# Patient Record
Sex: Male | Born: 1985 | Race: Black or African American | Hispanic: No | Marital: Single
Health system: Midwestern US, Community
[De-identification: ages and names within clinical notes are randomized; demographics above are authoritative.]

## PROBLEM LIST (undated history)

## (undated) DIAGNOSIS — Z9889 Other specified postprocedural states: Secondary | ICD-10-CM

## (undated) DIAGNOSIS — K5900 Constipation, unspecified: Secondary | ICD-10-CM

## (undated) HISTORY — PX: HAND SURGERY: SHX662

## (undated) HISTORY — PX: ABDOMINAL SURGERY: SHX537

---

## 2013-11-06 ENCOUNTER — Encounter (HOSPITAL_BASED_OUTPATIENT_CLINIC_OR_DEPARTMENT_OTHER): Payer: Self-pay | Admitting: Emergency Medicine

## 2013-11-06 ENCOUNTER — Inpatient Hospital Stay (HOSPITAL_BASED_OUTPATIENT_CLINIC_OR_DEPARTMENT_OTHER)
Admission: EM | Admit: 2013-11-06 | Discharge: 2013-11-11 | DRG: 389 | Disposition: A | Discharge status: Home / Self Care | Attending: Internal Medicine | Admitting: Internal Medicine

## 2013-11-06 ENCOUNTER — Emergency Department (HOSPITAL_BASED_OUTPATIENT_CLINIC_OR_DEPARTMENT_OTHER)

## 2013-11-06 DIAGNOSIS — E860 Dehydration: Secondary | ICD-10-CM | POA: Diagnosis present

## 2013-11-06 DIAGNOSIS — K5900 Constipation, unspecified: Secondary | ICD-10-CM | POA: Diagnosis present

## 2013-11-06 DIAGNOSIS — Z79899 Other long term (current) drug therapy: Secondary | ICD-10-CM

## 2013-11-06 DIAGNOSIS — E559 Vitamin D deficiency, unspecified: Secondary | ICD-10-CM | POA: Diagnosis present

## 2013-11-06 DIAGNOSIS — K920 Hematemesis: Secondary | ICD-10-CM | POA: Diagnosis present

## 2013-11-06 DIAGNOSIS — Z91010 Allergy to peanuts: Secondary | ICD-10-CM

## 2013-11-06 DIAGNOSIS — F17200 Nicotine dependence, unspecified, uncomplicated: Secondary | ICD-10-CM | POA: Diagnosis present

## 2013-11-06 DIAGNOSIS — M26609 Unspecified temporomandibular joint disorder, unspecified side: Secondary | ICD-10-CM | POA: Diagnosis present

## 2013-11-06 DIAGNOSIS — K56609 Unspecified intestinal obstruction, unspecified as to partial versus complete obstruction: Principal | ICD-10-CM | POA: Diagnosis present

## 2013-11-06 DIAGNOSIS — N289 Disorder of kidney and ureter, unspecified: Secondary | ICD-10-CM

## 2013-11-06 DIAGNOSIS — Z91018 Allergy to other foods: Secondary | ICD-10-CM

## 2013-11-06 DIAGNOSIS — N179 Acute kidney failure, unspecified: Secondary | ICD-10-CM | POA: Diagnosis present

## 2013-11-06 HISTORY — DX: Other specified postprocedural states: Z98.890

## 2013-11-06 HISTORY — DX: Constipation, unspecified: K59.00

## 2013-11-06 LAB — COMPREHENSIVE METABOLIC PANEL
ALBUMIN: 5 g/dL (ref 3.5–5.2)
ALT: 8 U/L (ref 0–53)
AST: 16 U/L (ref 0–37)
Alkaline Phosphatase: 68 U/L (ref 39–117)
Anion gap: 16 — ABNORMAL HIGH (ref 5–15)
BILIRUBIN TOTAL: 1.1 mg/dL (ref 0.3–1.2)
BUN: 23 mg/dL (ref 6–23)
CO2: 32 meq/L (ref 19–32)
CREATININE: 1.6 mg/dL — AB (ref 0.50–1.35)
Calcium: 11.2 mg/dL — ABNORMAL HIGH (ref 8.4–10.5)
Chloride: 94 mEq/L — ABNORMAL LOW (ref 96–112)
GFR calc Af Amer: 67 mL/min — ABNORMAL LOW (ref >90)
GFR, EST NON AFRICAN AMERICAN: 58 mL/min — AB (ref >90)
Glucose, Bld: 100 mg/dL — ABNORMAL HIGH (ref 70–99)
Potassium: 4.5 mEq/L (ref 3.7–5.3)
Sodium: 142 mEq/L (ref 137–147)
Total Protein: 8.8 g/dL — ABNORMAL HIGH (ref 6.0–8.3)

## 2013-11-06 LAB — CBC WITH DIFFERENTIAL/PLATELET
BASOS ABS: 0 10*3/uL (ref 0.0–0.1)
Basophils Relative: 0 % (ref 0–1)
EOS ABS: 0 10*3/uL (ref 0.0–0.7)
EOS PCT: 0 % (ref 0–5)
HEMATOCRIT: 46.6 % (ref 39.0–52.0)
Hemoglobin: 15.3 g/dL (ref 13.0–17.0)
LYMPHS PCT: 17 % (ref 12–46)
Lymphs Abs: 1 10*3/uL (ref 0.7–4.0)
MCH: 29.5 pg (ref 26.0–34.0)
MCHC: 32.8 g/dL (ref 30.0–36.0)
MCV: 89.8 fL (ref 78.0–100.0)
MONO ABS: 0.4 10*3/uL (ref 0.1–1.0)
Monocytes Relative: 8 % (ref 3–12)
Neutro Abs: 4.2 10*3/uL (ref 1.7–7.7)
Neutrophils Relative %: 75 % (ref 43–77)
Platelets: 214 10*3/uL (ref 150–400)
RBC: 5.19 MIL/uL (ref 4.22–5.81)
RDW: 13.3 % (ref 11.5–15.5)
WBC: 5.6 10*3/uL (ref 4.0–10.5)

## 2013-11-06 LAB — I-STAT CG4 LACTIC ACID, ED: LACTIC ACID, VENOUS: 1.68 mmol/L (ref 0.5–2.2)

## 2013-11-06 LAB — LIPASE, BLOOD: LIPASE: 19 U/L (ref 11–59)

## 2013-11-06 MED ORDER — MORPHINE SULFATE 4 MG/ML IJ SOLN
4.0000 mg | Freq: Once | INTRAMUSCULAR | Status: AC
  Administered 2013-11-06: 4 mg via INTRAVENOUS
  Filled 2013-11-06: qty 1

## 2013-11-06 MED ORDER — CHLORHEXIDINE GLUCONATE 0.12 % MT SOLN
15.0000 mL | Freq: Two times a day (BID) | OROMUCOSAL | Status: DC
  Administered 2013-11-06 – 2013-11-08 (×5): 15 mL via OROMUCOSAL
  Filled 2013-11-06 (×8): qty 15

## 2013-11-06 MED ORDER — SODIUM CHLORIDE 0.9 % IV SOLN
Freq: Once | INTRAVENOUS | Status: AC
  Administered 2013-11-06: 1000 mL via INTRAVENOUS

## 2013-11-06 MED ORDER — CETYLPYRIDINIUM CHLORIDE 0.05 % MT LIQD
7.0000 mL | Freq: Two times a day (BID) | OROMUCOSAL | Status: DC
  Administered 2013-11-07: 7 mL via OROMUCOSAL

## 2013-11-06 MED ORDER — SODIUM CHLORIDE 0.9 % IV SOLN
INTRAVENOUS | Status: AC
  Administered 2013-11-06: 18:00:00 via INTRAVENOUS

## 2013-11-06 MED ORDER — MORPHINE SULFATE 2 MG/ML IJ SOLN
2.0000 mg | INTRAMUSCULAR | Status: DC | PRN
  Administered 2013-11-06 – 2013-11-08 (×8): 2 mg via INTRAVENOUS
  Filled 2013-11-06 (×8): qty 1

## 2013-11-06 MED ORDER — PROMETHAZINE HCL 25 MG/ML IJ SOLN
INTRAMUSCULAR | Status: AC
  Administered 2013-11-06: 12.5 mg via INTRAVENOUS
  Filled 2013-11-06: qty 1

## 2013-11-06 MED ORDER — SODIUM CHLORIDE 0.9 % IV SOLN
INTRAVENOUS | Status: AC
  Administered 2013-11-06: 19:00:00 via INTRAVENOUS

## 2013-11-06 MED ORDER — PROMETHAZINE HCL 25 MG/ML IJ SOLN
12.5000 mg | Freq: Once | INTRAMUSCULAR | Status: AC
  Administered 2013-11-06: 12.5 mg via INTRAVENOUS

## 2013-11-06 MED ORDER — PROMETHAZINE HCL 25 MG/ML IJ SOLN: 12.5000 mg | Freq: Once | INTRAMUSCULAR | Status: DC

## 2013-11-06 MED ORDER — ONDANSETRON HCL 4 MG/2ML IJ SOLN
4.0000 mg | Freq: Once | INTRAMUSCULAR | Status: AC
  Administered 2013-11-06: 4 mg via INTRAVENOUS
  Filled 2013-11-06: qty 2

## 2013-11-06 MED ORDER — SODIUM CHLORIDE 0.9 % IV SOLN
Freq: Once | INTRAVENOUS | Status: AC
  Administered 2013-11-07: 06:00:00 via INTRAVENOUS
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
## 2013-11-06 NOTE — ED Notes (Signed)
[facility] --[id number]

## 2013-11-07 ENCOUNTER — Inpatient Hospital Stay (HOSPITAL_COMMUNITY)

## 2013-11-07 ENCOUNTER — Encounter (HOSPITAL_COMMUNITY): Payer: Self-pay | Admitting: General Surgery

## 2013-11-07 DIAGNOSIS — K565 Intestinal adhesions [bands], unspecified as to partial versus complete obstruction: Secondary | ICD-10-CM

## 2013-11-07 DIAGNOSIS — K56609 Unspecified intestinal obstruction, unspecified as to partial versus complete obstruction: Principal | ICD-10-CM | POA: Diagnosis present

## 2013-11-07 LAB — CBC
HEMATOCRIT: 43.2 % (ref 39.0–52.0)
HEMOGLOBIN: 13.6 g/dL (ref 13.0–17.0)
MCH: 29.3 pg (ref 26.0–34.0)
MCHC: 31.5 g/dL (ref 30.0–36.0)
MCV: 93.1 fL (ref 78.0–100.0)
Platelets: 161 10*3/uL (ref 150–400)
RBC: 4.64 MIL/uL (ref 4.22–5.81)
RDW: 13.4 % (ref 11.5–15.5)
WBC: 4.1 10*3/uL (ref 4.0–10.5)

## 2013-11-07 LAB — TSH: TSH: 0.927 u[IU]/mL (ref 0.350–4.500)

## 2013-11-07 LAB — CREATININE, SERUM
CREATININE: 1.38 mg/dL — AB (ref 0.50–1.35)
GFR calc Af Amer: 80 mL/min — ABNORMAL LOW (ref >90)
GFR, EST NON AFRICAN AMERICAN: 69 mL/min — AB (ref >90)

## 2013-11-07 MED ORDER — SODIUM CHLORIDE 0.9 % IV SOLN: INTRAVENOUS | Status: AC

## 2013-11-07 MED ORDER — ACETAMINOPHEN 650 MG RE SUPP: 650.0000 mg | Freq: Four times a day (QID) | RECTAL | Status: DC | PRN

## 2013-11-07 MED ORDER — HYDROMORPHONE HCL PF 1 MG/ML IJ SOLN
0.5000 mg | INTRAMUSCULAR | Status: DC | PRN
  Administered 2013-11-07 – 2013-11-08 (×5): 0.5 mg via INTRAVENOUS
  Filled 2013-11-07 (×5): qty 1

## 2013-11-07 MED ORDER — ENOXAPARIN SODIUM 40 MG/0.4ML ~~LOC~~ SOLN
40.0000 mg | SUBCUTANEOUS | Status: DC
  Administered 2013-11-07 – 2013-11-10 (×4): 40 mg via SUBCUTANEOUS
  Filled 2013-11-07 (×5): qty 0.4

## 2013-11-07 MED ORDER — ONDANSETRON HCL 4 MG/2ML IJ SOLN
4.0000 mg | Freq: Four times a day (QID) | INTRAMUSCULAR | Status: DC | PRN
  Administered 2013-11-07 – 2013-11-10 (×7): 4 mg via INTRAVENOUS
  Filled 2013-11-07 (×7): qty 2

## 2013-11-07 MED ORDER — ACETAMINOPHEN 325 MG PO TABS: 650.0000 mg | ORAL_TABLET | Freq: Four times a day (QID) | ORAL | Status: DC | PRN
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
## 2013-11-07 NOTE — Consult Note (Signed)
I saw the patient, participated in the history, exam and medical decision making, and concur with the physician assistant's note above.  Pt seen & examined around 5. Not to talkative. Still c/o some abd pain. +flatus.  Soft, nd, mild TTP  xrays today show air now in colon. Still some sb distension  Cont nonop for now - seems to be improving.  No fever, no wbc, no tachy Cont ngt to liws.   Mary SellaEric M. Andrey CampanileWilson, MD, FACS General, Bariatric, & Minimally Invasive Surgery [facility] [place]

## 2013-11-07 NOTE — Progress Notes (Signed)
Attending called and made aware of pt's arrival.  Verbal orders given and placed.  MD advised me to call the flow manager for assigned attending overnight.  Called flow manager and she stated she would send out a page.  Will continue to monitor and carry out orders. Vanice Sarahhompson, Daisy Lites L

## 2013-11-08 ENCOUNTER — Inpatient Hospital Stay (HOSPITAL_COMMUNITY)

## 2013-11-08 DIAGNOSIS — M26609 Unspecified temporomandibular joint disorder, unspecified side: Secondary | ICD-10-CM | POA: Diagnosis present

## 2013-11-08 DIAGNOSIS — K56609 Unspecified intestinal obstruction, unspecified as to partial versus complete obstruction: Secondary | ICD-10-CM | POA: Diagnosis not present

## 2013-11-08 LAB — COMPREHENSIVE METABOLIC PANEL
ALBUMIN: 3.5 g/dL (ref 3.5–5.2)
ALK PHOS: 48 U/L (ref 39–117)
ALT: 6 U/L (ref 0–53)
AST: 10 U/L (ref 0–37)
Anion gap: 10 (ref 5–15)
BILIRUBIN TOTAL: 0.7 mg/dL (ref 0.3–1.2)
BUN: 23 mg/dL (ref 6–23)
CO2: 29 mEq/L (ref 19–32)
Calcium: 9.2 mg/dL (ref 8.4–10.5)
Chloride: 105 mEq/L (ref 96–112)
Creatinine, Ser: 1.34 mg/dL (ref 0.50–1.35)
GFR calc Af Amer: 83 mL/min — ABNORMAL LOW (ref >90)
GFR calc non Af Amer: 71 mL/min — ABNORMAL LOW (ref >90)
Glucose, Bld: 68 mg/dL — ABNORMAL LOW (ref 70–99)
POTASSIUM: 4.4 meq/L (ref 3.7–5.3)
SODIUM: 144 meq/L (ref 137–147)
TOTAL PROTEIN: 6.1 g/dL (ref 6.0–8.3)

## 2013-11-08 LAB — PTH, INTACT AND CALCIUM
CALCIUM TOTAL (PTH): 9.7 mg/dL (ref 8.4–10.5)
PTH: 30.6 pg/mL (ref 14.0–72.0)

## 2013-11-08 LAB — VITAMIN D 25 HYDROXY (VIT D DEFICIENCY, FRACTURES): Vit D, 25-Hydroxy: <10 ng/mL — ABNORMAL LOW (ref 30–89)

## 2013-11-08 MED ORDER — DIPHENHYDRAMINE HCL 25 MG PO CAPS
25.0000 mg | ORAL_CAPSULE | Freq: Three times a day (TID) | ORAL | Status: DC | PRN
  Administered 2013-11-08 – 2013-11-09 (×2): 25 mg via ORAL
  Filled 2013-11-08 (×2): qty 1

## 2013-11-08 MED ORDER — TRAMADOL HCL 50 MG PO TABS
50.0000 mg | ORAL_TABLET | Freq: Four times a day (QID) | ORAL | Status: DC | PRN
  Administered 2013-11-08 – 2013-11-10 (×7): 100 mg via ORAL
  Filled 2013-11-08 (×7): qty 2

## 2013-11-08 MED ORDER — HYDROMORPHONE HCL PF 1 MG/ML IJ SOLN
0.5000 mg | INTRAMUSCULAR | Status: DC | PRN
  Administered 2013-11-08 – 2013-11-11 (×9): 0.5 mg via INTRAVENOUS
  Filled 2013-11-08 (×10): qty 1

## 2013-11-08 MED ORDER — VITAMIN D (ERGOCALCIFEROL) 1.25 MG (50000 UNIT) PO CAPS
50000.0000 [IU] | ORAL_CAPSULE | ORAL | Status: DC
  Administered 2013-11-08: 50000 [IU] via ORAL
  Filled 2013-11-08: qty 1

## 2013-11-08 MED ORDER — NAPROXEN 500 MG PO TABS
500.0000 mg | ORAL_TABLET | Freq: Two times a day (BID) | ORAL | Status: DC
  Administered 2013-11-08 – 2013-11-10 (×4): 500 mg via ORAL
  Filled 2013-11-08 (×4): qty 1
  Filled 2013-11-08: qty 2
  Filled 2013-11-08 (×2): qty 1

## 2013-11-08 MED ORDER — SODIUM CHLORIDE 0.9 % IV SOLN
INTRAVENOUS | Status: DC
  Administered 2013-11-08 – 2013-11-10 (×6): via INTRAVENOUS
  Administered 2013-11-10: 1 mL via INTRAVENOUS
  Administered 2013-11-10 – 2013-11-11 (×2): via INTRAVENOUS

## 2013-11-08 NOTE — Progress Notes (Signed)
UR completed.  Tynia Wiers, RN BSN MHA CCM Trauma/Neuro ICU Case Manager [phone]  

## 2013-11-08 NOTE — Progress Notes (Signed)
Patient ID: Antonio Maddox, male   DOB: 1985-07-11, 28 y.o.   MRN: [id number]   LOS: 2 days   Subjective: Denies N/V. Continues w/flatus.   Objective: Vital signs in last 24 hours: Temp:  [98.2 F (36.8 C)-98.3 F (36.8 C)] 98.3 F (36.8 C) (08/06 0628) Pulse Rate:  [52-95] 95 (08/06 0628) Resp:  [18-20] 20 (08/06 0628) BP: (116-134)/(59-68) 134/59 mmHg (08/06 0628) SpO2:  [96 %-100 %] 100 % (08/06 0628) Last BM Date: 11/03/13   NGT: 500ml   Laboratory  BMET  Recent Labs  11/06/13 1505 11/07/13 1428 11/08/13 0514  NA 142  --  144  K 4.5  --  4.4  CL 94*  --  105  CO2 32  --  29  GLUCOSE 100*  --  68*  BUN 23  --  23  CREATININE 1.60* 1.38* 1.34  CALCIUM 11.2*  --  9.2    Radiology Results ABDOMEN - 1 VIEW  COMPARISON: 11/07/2013  FINDINGS:  NG tube is coiled in the stomach. The tube is kinked proximal to the  side port.  Decreasing small bowel distention. Gas within nondistended colon. A  few mildly prominent pelvic small bowel loops persist. No free air  organomegaly.  IMPRESSION:  Improving small bowel obstruction pattern.  Electronically Signed  By: Charlett NoseKevin Dover M.D.  On: 11/08/2013 09:17   Physical Exam General appearance: alert and no distress Resp: clear to auscultation bilaterally Cardio: regular rate and rhythm GI: Soft, no distension, mild-to-mod TTP, some guarding   Assessment/Plan: SBO -- Improved again today. D/C NGT, continue sips/chips but may advance this afternoon if still doing well.   Freeman CaldronMichael J. Philippe Gang, PA-C Pager: [phone] General Trauma [place] Pager: [phone]  11/08/2013

## 2013-11-08 NOTE — Progress Notes (Signed)
Pt seen & examined this late pm No c/o. Some flatus. No n/v.   Soft, nd, not really tender  Sips tonight Clear liquid Friday am if no issues overnight  Mary SellaEric M. Andrey CampanileWilson, MD, FACS General, Bariatric, & Minimally Invasive Surgery [facility], [place]
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
## 2013-11-08 NOTE — ED Provider Notes (Signed)
Medical screening examination/treatment/procedure(s) were performed by non-physician practitioner and as supervising physician I was immediately available for consultation/collaboration.     Geoffery Lyonsouglas Avrian Delfavero, MD 11/08/13 1110

## 2013-11-09 DIAGNOSIS — K56609 Unspecified intestinal obstruction, unspecified as to partial versus complete obstruction: Secondary | ICD-10-CM | POA: Diagnosis not present

## 2013-11-09 LAB — COMPREHENSIVE METABOLIC PANEL
ALK PHOS: 45 U/L (ref 39–117)
ALT: 15 U/L (ref 0–53)
AST: 19 U/L (ref 0–37)
Albumin: 3.6 g/dL (ref 3.5–5.2)
Anion gap: 11 (ref 5–15)
BILIRUBIN TOTAL: 0.6 mg/dL (ref 0.3–1.2)
BUN: 11 mg/dL (ref 6–23)
CO2: 26 meq/L (ref 19–32)
Calcium: 8.9 mg/dL (ref 8.4–10.5)
Chloride: 103 mEq/L (ref 96–112)
Creatinine, Ser: 1.19 mg/dL (ref 0.50–1.35)
GFR calc Af Amer: >90 mL/min (ref >90)
GFR, EST NON AFRICAN AMERICAN: 82 mL/min — AB (ref >90)
GLUCOSE: 96 mg/dL (ref 70–99)
POTASSIUM: 3.9 meq/L (ref 3.7–5.3)
SODIUM: 140 meq/L (ref 137–147)
TOTAL PROTEIN: 5.9 g/dL — AB (ref 6.0–8.3)

## 2013-11-09 MED ORDER — PROMETHAZINE HCL 25 MG/ML IJ SOLN
12.5000 mg | Freq: Once | INTRAMUSCULAR | Status: AC
  Administered 2013-11-09: 12.5 mg via INTRAVENOUS
  Filled 2013-11-09: qty 1
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
## 2013-11-09 NOTE — Progress Notes (Signed)
Not terribly talkative, reports flatus. No nausea; tech reports he is taking liquids very well Alert nad Soft, nd, very very mild TTP  Agree with diet advancement  Appears sbo resolving  Mary Sellaric M. Andrey CampanileWilson, MD, FACS General, Bariatric, & Minimally Invasive Surgery [facility], [place]

## 2013-11-09 NOTE — Progress Notes (Signed)
[facility] called for clinical update. Advised her of yesterday's plan from the notes of providing sips of clears and potentially advancing to clear diet if that was tolerated overnight.

## 2013-11-09 NOTE — Progress Notes (Signed)
Patient ID: Antonio Maddox, male   DOB: 03/01/1986, 28 y.o.   MRN: [id number]    Subjective: Patient doesn't say much. No specific complaints. Past flatus yesterday. Tolerating his clear liquids  Objective: Vital signs in last 24 hours: Temp:  [97.3 F (36.3 C)-97.7 F (36.5 C)] 97.5 F (36.4 C) (08/07 [id number]) Pulse Rate:  [53-77] 62 (08/07 0614) Resp:  [18-20] 20 (08/07 0614) BP: (102-114)/(57-82) 114/82 mmHg (08/07 0649) SpO2:  [96 %-100 %] 96 % (08/07 0614) Last BM Date: 11/03/13  Intake/Output from previous day: 08/06 0701 - 08/07 0700 In: 3051.7 [P.O.:1060; I.V.:1991.7] Out: 1451 [Urine:1451] Intake/Output this shift: Total I/O In: 240 [P.O.:240] Out: -   PE: Abd: Soft, nondistended, and minimal discomfort with palpation in the right lower quadrant, active bowel sounds  Lab Results:   Recent Labs  11/06/13 1505 11/07/13 1428  WBC 5.6 4.1  HGB 15.3 13.6  HCT 46.6 43.2  PLT 214 161   BMET  Recent Labs  11/08/13 0514 11/09/13 0655  NA 144 140  K 4.4 3.9  CL 105 103  CO2 29 26  GLUCOSE 68* 96  BUN 23 11  CREATININE 1.34 1.19  CALCIUM 9.2 8.9   PT/INR No results found for this basename: LABPROT, INR,  in the last 72 hours CMP     Component Value Date/Time   NA 140 11/09/2013 0655   K 3.9 11/09/2013 0655   CL 103 11/09/2013 0655   CO2 26 11/09/2013 0655   GLUCOSE 96 11/09/2013 0655   BUN 11 11/09/2013 0655   CREATININE 1.19 11/09/2013 0655   CALCIUM 8.9 11/09/2013 0655   CALCIUM 9.7 11/07/2013 1128   PROT 5.9* 11/09/2013 0655   ALBUMIN 3.6 11/09/2013 0655   AST 19 11/09/2013 0655   ALT 15 11/09/2013 0655   ALKPHOS 45 11/09/2013 0655   BILITOT 0.6 11/09/2013 0655   GFRNONAA 82* 11/09/2013 0655   GFRAA >90 11/09/2013 0655   Lipase     Component Value Date/Time   LIPASE 19 11/06/2013 1505       Studies/Results: Dg Abd 1 View  11/08/2013   CLINICAL DATA:  Small bowel obstruction.  Patient feeling better.  EXAM: ABDOMEN - 1 VIEW  COMPARISON:  11/07/2013  FINDINGS: NG tube  is coiled in the stomach. The tube is kinked proximal to the side port.  Decreasing small bowel distention. Gas within nondistended colon. A few mildly prominent pelvic small bowel loops persist. No free air organomegaly.  IMPRESSION: Improving small bowel obstruction pattern.   Electronically Signed   By: Charlett NoseKevin  Dover M.D.   On: 11/08/2013 09:17   Dg Abd 1 View  11/07/2013   CLINICAL DATA:  Follow-up small bowel obstruction.  EXAM: ABDOMEN - 1 VIEW  COMPARISON:  Acute abdomen series yesterday.  FINDINGS: Interval improvement in the partial small bowel obstruction, though multiple dilated loops of small bowel persist in the upper abdomen. Gas is present throughout the normal caliber colon from cecum to rectum. No suggestion of free air on the supine image. Nasogastric tube looped in the stomach.  IMPRESSION: Improvement in the partial small bowel obstruction since yesterday, not yet completely radiographically resolved.   Electronically Signed   By: Hulan Saashomas  Lawrence M.D.   On: 11/07/2013 11:26    Anti-infectives: Anti-infectives   None       Assessment/Plan  1. PSBO, resolving  Plan: 1. We can advance the patient to a full liquid diet. He tolerates this hopefully he can have solid food  tomorrow.   LOS: 3 days    Jacoby Zanni E 11/09/2013, 9:21 AM Pager: [phone]

## 2013-11-09 NOTE — Progress Notes (Signed)
Pt c/o pain sharp and cramping, reports no BM, states nausea and request juice, medicated with prn for pain.

## 2013-11-10 ENCOUNTER — Inpatient Hospital Stay (HOSPITAL_COMMUNITY)

## 2013-11-10 DIAGNOSIS — K56609 Unspecified intestinal obstruction, unspecified as to partial versus complete obstruction: Secondary | ICD-10-CM | POA: Diagnosis not present

## 2013-11-10 MED ORDER — METOCLOPRAMIDE HCL 5 MG/ML IJ SOLN
10.0000 mg | Freq: Four times a day (QID) | INTRAMUSCULAR | Status: DC | PRN
  Administered 2013-11-10: 10 mg via INTRAVENOUS
  Filled 2013-11-10: qty 2

## 2013-11-10 MED ORDER — POLYETHYLENE GLYCOL 3350 17 G PO PACK
17.0000 g | PACK | Freq: Two times a day (BID) | ORAL | Status: DC
  Administered 2013-11-10 (×2): 17 g via ORAL
  Filled 2013-11-10 (×4): qty 1

## 2013-11-10 NOTE — Progress Notes (Signed)
Call placed to Dr. Susie CassetteAbrol due to patient c/o nausea which Zofran has not helped. Order obtained for Reglan and may replace NG tube if Reglan does not help nausea. Went in to give Reglan and patient had head covered up and was asleep. Reglan held.

## 2013-11-10 NOTE — Plan of Care (Signed)
Problem: Phase I Progression Outcomes Goal: OOB as tolerated unless otherwise ordered Outcome: Completed/Met Date Met:  11/10/13 [employer] guards present and allowed patient up in room only
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
## 2013-11-11 DIAGNOSIS — K56609 Unspecified intestinal obstruction, unspecified as to partial versus complete obstruction: Secondary | ICD-10-CM | POA: Diagnosis not present

## 2013-11-11 LAB — VITAMIN D 1,25 DIHYDROXY
VITAMIN D 1, 25 (OH) TOTAL: 44 pg/mL (ref 18–72)
Vitamin D2 1, 25 (OH)2: <8 pg/mL
Vitamin D3 1, 25 (OH)2: 44 pg/mL

## 2013-11-11 MED ORDER — VITAMIN D (ERGOCALCIFEROL) 1.25 MG (50000 UNIT) PO CAPS: 50000.0000 [IU] | ORAL_CAPSULE | ORAL | Status: AC

## 2013-11-11 MED ORDER — ACETAMINOPHEN-CODEINE #3 300-30 MG PO TABS: 1.0000 | ORAL_TABLET | ORAL | Status: AC | PRN

## 2013-11-11 MED ORDER — DOCUSATE SODIUM 100 MG PO CAPS: 200.0000 mg | ORAL_CAPSULE | Freq: Two times a day (BID) | ORAL | Status: AC

## 2013-11-11 MED ORDER — METOCLOPRAMIDE HCL 10 MG PO TABS: 10.0000 mg | ORAL_TABLET | Freq: Four times a day (QID) | ORAL | Status: AC

## 2013-11-11 MED ORDER — POLYETHYLENE GLYCOL 3350 17 G PO PACK: 17.0000 g | PACK | Freq: Two times a day (BID) | ORAL | Status: AC

## 2013-11-11 NOTE — Progress Notes (Signed)
IV removed per order for discharge. Discharge instructions given to patient and guard. Discharged via wheelchair with guards and NT present.
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
## 2018-08-26 DIAGNOSIS — L723 Sebaceous cyst: Secondary | ICD-10-CM

## 2018-08-26 NOTE — ED Provider Notes (Signed)
33 year old gentleman presents with concerns about a mole bumps on his penis that he says have developed over the last 36 hours.  He says with this he has had no pain, itchiness, drainage, or discharge.  No other associated symptoms.               History reviewed. No pertinent past medical history.    History reviewed. No pertinent surgical history.      History reviewed. No pertinent family history.    Social History     Socioeconomic History   ??? Marital status: SINGLE     Spouse name: Not on file   ??? Number of children: Not on file   ??? Years of education: Not on file   ??? Highest education level: Not on file   Occupational History   ??? Not on file   Social Needs   ??? Financial resource strain: Not on file   ??? Food insecurity     Worry: Not on file     Inability: Not on file   ??? Transportation needs     Medical: Not on file     Non-medical: Not on file   Tobacco Use   ??? Smoking status: Current Every Day Smoker     Packs/day: 0.50   ??? Smokeless tobacco: Never Used   Substance and Sexual Activity   ??? Alcohol use: Not on file   ??? Drug use: Not on file   ??? Sexual activity: Not on file   Lifestyle   ??? Physical activity     Days per week: Not on file     Minutes per session: Not on file   ??? Stress: Not on file   Relationships   ??? Social Wellsite geologist on phone: Not on file     Gets together: Not on file     Attends religious service: Not on file     Active member of club or organization: Not on file     Attends meetings of clubs or organizations: Not on file     Relationship status: Not on file   ??? Intimate partner violence     Fear of current or ex partner: Not on file     Emotionally abused: Not on file     Physically abused: Not on file     Forced sexual activity: Not on file   Other Topics Concern   ??? Not on file   Social History Narrative   ??? Not on file         ALLERGIES: Patient has no allergy information on record.    Review of Systems   Constitutional: Negative for chills and fever.   Genitourinary:  Negative for discharge, penile pain, penile swelling and scrotal swelling.       Vitals:    08/26/18 2256   BP: 141/75   Pulse: 69   Resp: 16   Temp: 98.2 ??F (36.8 ??C)   SpO2: 100%   Weight: 74.8 kg (165 lb)   Height: 6\' 5"  (1.956 m)            Physical Exam  Vitals signs reviewed.   Genitourinary:     Comments: Patient has a few small sebaceous cysts but no other lesions         MDM       Procedures

## 2018-08-26 NOTE — ED Notes (Signed)

## 2018-08-26 NOTE — ED Notes (Signed)
Pt complains of bump on his penis X1 day. Pt denies any new sexual partners, but reports his girl "be trippin" and has a UTI.

## 2018-08-26 NOTE — ED Triage Notes (Signed)
Pt complains of bump on his penis X1 day. Pt denies any new sexual partners, but reports his girl "be trippin" and has a UTI.

## 2018-08-26 NOTE — ED Provider Notes (Signed)
33 year old gentleman presents with concerns about a mole bumps on his penis that he says have developed over the last 36 hours.  He says with this he has had no pain, itchiness, drainage, or discharge.  No other associated symptoms.               History reviewed. No pertinent past medical history.    History reviewed. No pertinent surgical history.      History reviewed. No pertinent family history.    Social History     Socioeconomic History   ??? Marital status: SINGLE     Spouse name: Not on file   ??? Number of children: Not on file   ??? Years of education: Not on file   ??? Highest education level: Not on file   Occupational History   ??? Not on file   Social Needs   ??? Financial resource strain: Not on file   ??? Food insecurity     Worry: Not on file     Inability: Not on file   ??? Transportation needs     Medical: Not on file     Non-medical: Not on file   Tobacco Use   ??? Smoking status: Current Every Day Smoker     Packs/day: 0.50   ??? Smokeless tobacco: Never Used   Substance and Sexual Activity   ??? Alcohol use: Not on file   ??? Drug use: Not on file   ??? Sexual activity: Not on file   Lifestyle   ??? Physical activity     Days per week: Not on file     Minutes per session: Not on file   ??? Stress: Not on file   Relationships   ??? Social Wellsite geologist on phone: Not on file     Gets together: Not on file     Attends religious service: Not on file     Active member of club or organization: Not on file     Attends meetings of clubs or organizations: Not on file     Relationship status: Not on file   ??? Intimate partner violence     Fear of current or ex partner: Not on file     Emotionally abused: Not on file     Physically abused: Not on file     Forced sexual activity: Not on file   Other Topics Concern   ??? Not on file   Social History Narrative   ??? Not on file         ALLERGIES: Patient has no allergy information on record.    Review of Systems   Constitutional: Negative for chills and fever.    Genitourinary: Negative for discharge, penile pain, penile swelling and scrotal swelling.       Vitals:    08/26/18 2256   BP: 141/75   Pulse: 69   Resp: 16   Temp: 98.2 ??F (36.8 ??C)   SpO2: 100%   Weight: 74.8 kg (165 lb)   Height: 6\' 5"  (1.956 m)            Physical Exam  Vitals signs reviewed.   Genitourinary:     Comments: Patient has a few small sebaceous cysts but no other lesions         MDM       Procedures

## 2018-08-26 NOTE — ED Notes (Signed)

## 2018-08-27 ENCOUNTER — Inpatient Hospital Stay
Admit: 2018-08-27 | Discharge: 2018-08-27 | Disposition: A | Discharge status: Home / Self Care | Payer: Self-pay | Attending: Emergency Medicine

## 2022-05-04 DIAGNOSIS — K5651 Intestinal adhesions [bands], with partial obstruction: Secondary | ICD-10-CM | POA: Insufficient documentation

## 2022-05-04 DIAGNOSIS — Z9889 Other specified postprocedural states: Secondary | ICD-10-CM | POA: Insufficient documentation

## 2022-05-31 ENCOUNTER — Emergency Department (HOSPITAL_COMMUNITY)
Admission: EM | Admit: 2022-05-31 | Discharge: 2022-06-01 | Disposition: A | Discharge status: Home / Self Care | Payer: Medicaid Other | Attending: Emergency Medicine | Admitting: Emergency Medicine

## 2022-05-31 ENCOUNTER — Other Ambulatory Visit: Payer: Self-pay

## 2022-05-31 ENCOUNTER — Emergency Department (HOSPITAL_COMMUNITY): Payer: Medicaid Other

## 2022-05-31 ENCOUNTER — Encounter (HOSPITAL_COMMUNITY): Payer: Self-pay | Admitting: Emergency Medicine

## 2022-05-31 DIAGNOSIS — Z91010 Allergy to peanuts: Secondary | ICD-10-CM | POA: Insufficient documentation

## 2022-05-31 DIAGNOSIS — W230XXA Caught, crushed, jammed, or pinched between moving objects, initial encounter: Secondary | ICD-10-CM | POA: Diagnosis not present

## 2022-05-31 DIAGNOSIS — S9031XA Contusion of right foot, initial encounter: Secondary | ICD-10-CM | POA: Insufficient documentation

## 2022-05-31 DIAGNOSIS — Y990 Civilian activity done for income or pay: Secondary | ICD-10-CM | POA: Insufficient documentation

## 2022-05-31 DIAGNOSIS — S99921A Unspecified injury of right foot, initial encounter: Secondary | ICD-10-CM | POA: Diagnosis present

## 2022-05-31 MED ORDER — IBUPROFEN 400 MG PO TABS
600.0000 mg | ORAL_TABLET | Freq: Once | ORAL | Status: AC
  Administered 2022-05-31: 600 mg via ORAL
  Filled 2022-05-31: qty 1

## 2022-05-31 NOTE — ED Triage Notes (Addendum)
Pt reports he smashed his right big toe by dropping a pallet on it.  "It hurts too much to take the wrapping off it."  Pt stopped answering questions stating, "I have been to the hospital many times and have never had those questions" regarding safety, SI/HI or religious believes about blood products, "sounds like you want to kill me."

## 2022-05-31 NOTE — ED Provider Triage Note (Signed)
Emergency Medicine Provider Triage Evaluation Note  Kahleel Fertitta. Broaden , a 37 y.o. male  was evaluated in triage.  Pt complains of pain in the right foot primarily in the right great toe after a pallet was dropped on his foot at work around 9 PM.  Some swelling and decree sensation in the distal toes, exquisite pain with walking.  Limping into the ED.  Review of Systems  Positive: As above Negative: As above  Physical Exam  BP (!) 161/90 (BP Location: Right Arm)   Pulse 64   Temp 97.8 F (36.6 C) (Oral)   Resp 18   Ht '6\' 5"'$  (1.956 m)   Wt 71.7 kg   SpO2 99%   BMI 18.74 kg/m  Gen:   Awake, no distress   Resp:  Normal effort  MSK:   Moves extremities without difficulty  Other:  Swelling to all 5 toes of the right foot primarily to the great toe, tiny skin tear at the nailbed of the right great toe, oozing scant blood.  Patient able to wiggle toes, normal cap refill in all 5 digits of the right foot.  Medical Decision Making  Medically screening exam initiated at 10:53 PM.  Appropriate orders placed.  Glendell Docker T. Hurd was informed that the remainder of the evaluation will be completed by another provider, this initial triage assessment does not replace that evaluation, and the importance of remaining in the ED until their evaluation is complete.  This chart was dictated using voice recognition software, Dragon. Despite the best efforts of this provider to proofread and correct errors, errors may still occur which can change documentation meaning.    Emeline Darling, PA-C 05/31/22 2254

## 2022-06-01 MED ORDER — ACETAMINOPHEN 325 MG PO TABS
650.0000 mg | ORAL_TABLET | Freq: Once | ORAL | Status: AC
  Administered 2022-06-01: 650 mg via ORAL
  Filled 2022-06-01: qty 2

## 2022-06-01 NOTE — Discharge Instructions (Signed)
You are seen in the ER today for the pain in your foot.  Fortunately there are no broken bones on your x-ray.  You have a contusion, a deep bruise to the foot.  You may take Tylenol and ibuprofen as needed, you may ice the area for 15 to 20 minutes at a time a few times a day.  You may use the provided crutches for the next few days and then begin bearing weight on the foot as tolerated.  Return to the ER with any new severe symptoms.

## 2022-06-01 NOTE — ED Provider Notes (Signed)
[facility] Provider Note   CSN: [id number] Arrival date & time: 05/31/22  2202     History  Chief Complaint  Patient presents with   Toe Pain    Antonio Docker T. Antonio Maddox is a 38 y.o. male who presents with pain in the right foot after a pallet was dropped on it at work today.  Was not wearing steel toed boots. No numbness, tingling or weakness in the foot. Moving all toes, walking with a limp into the ED.   I personally read his medical records.  History of SBO.  No medications daily.  HPI     Home Medications Prior to Admission medications   Medication Sig Start Date End Date Taking? Authorizing Provider  acetaminophen-codeine (TYLENOL #3) 300-30 MG per tablet Take 1-2 tablets by mouth every 4 (four) hours as needed for moderate pain. 11/11/13  Yes Reyne Dumas, MD  polyethylene glycol (MIRALAX / GLYCOLAX) packet Take 17 g by mouth 2 (two) times daily. 11/11/13  Yes Reyne Dumas, MD  docusate sodium (COLACE) 100 MG capsule Take 2 capsules (200 mg total) by mouth 2 (two) times daily. Patient not taking: Reported on 05/31/2022 11/11/13   Reyne Dumas, MD  metoCLOPramide (REGLAN) 10 MG tablet Take 1 tablet (10 mg total) by mouth 4 (four) times daily. Patient not taking: Reported on 05/31/2022 11/11/13   Reyne Dumas, MD  Vitamin D, Ergocalciferol, (DRISDOL) 50000 UNITS CAPS capsule Take 1 capsule (50,000 Units total) by mouth every 7 (seven) days. Patient not taking: Reported on 05/31/2022 11/11/13   Reyne Dumas, MD      Allergies    Banana and Peanut-containing drug products    Review of Systems   Review of Systems  Musculoskeletal:        R foot pain    Physical Exam Updated Vital Signs BP (!) 161/90 (BP Location: Right Arm)   Pulse 64   Temp 97.8 F (36.6 C) (Oral)   Resp 18   Ht '6\' 5"'$  (1.956 m)   Wt 71.7 kg   SpO2 99%   BMI 18.74 kg/m  Physical Exam Vitals and nursing note reviewed.  Constitutional:      Appearance: He is not  ill-appearing or toxic-appearing.  HENT:     Head: Normocephalic and atraumatic.  Eyes:     General: No scleral icterus.       Right eye: No discharge.        Left eye: No discharge.     Conjunctiva/sclera: Conjunctivae normal.  Pulmonary:     Effort: Pulmonary effort is normal.  Musculoskeletal:       Feet:  Skin:    General: Skin is warm and dry.     Capillary Refill: Capillary refill takes less than 2 seconds.  Neurological:     General: No focal deficit present.     Mental Status: He is alert.  Psychiatric:        Mood and Affect: Mood normal.     ED Results / Procedures / Treatments   Labs (all labs ordered are listed, but only abnormal results are displayed) Labs Reviewed - No data to display  EKG None  Radiology DG Foot Complete Right  Result Date: 05/31/2022 CLINICAL DATA:  Crush injury right great toe EXAM: RIGHT FOOT COMPLETE - 3+ VIEW COMPARISON:  None Available. FINDINGS: Frontal, oblique, and lateral views of the right foot are obtained. No fracture, subluxation, or dislocation. Joint spaces are well preserved. Soft tissues are  unremarkable. IMPRESSION: 1. Unremarkable right foot. Electronically Signed   By: Randa Ngo M.D.   On: 05/31/2022 23:20    Procedures Procedures    Medications Ordered in ED Medications  acetaminophen (TYLENOL) tablet 650 mg (has no administration in time range)  ibuprofen (ADVIL) tablet 600 mg (600 mg Oral Given 05/31/22 2300)    ED Course/ Medical Decision Making/ A&P                             Medical Decision Making 37 year old male with right foot pain after a pallet dropped on it.  Hypertensive on intake having pain.  Neurovascularly intact in the foot.  Soft tissue swelling to the toes without bruising.  Normal cap refill DP pulse.  Moving all toes.  Tiny skin tear of the dorsum of the toe just proximal to the nailbed on the right great toe.  Amount and/or Complexity of Data Reviewed Radiology: ordered.     Details: X-ray negative for acute osseous abnormality.  Risk OTC drugs.   Clinical patient most consistent with acute contusion of the foot.  Patient remains neurovascular intact in the foot this time.  Oral analgesia offered in the ED as well as crutches.  No further workup warranted needed this time.  Clinical concern for emergent underlying injury that would warrant further ED workup or inpatient management is exceedingly low.  Antonio Maddox voiced understanding of his medical evaluation and treatment plan. Each of their questions answered to their expressed satisfaction.  Return precautions were given.  Patient is well-appearing, stable, and was discharged in good condition. This chart was dictated using voice recognition software, Dragon. Despite the best efforts of this provider to proofread and correct errors, errors may still occur which can change documentation meaning.           Final Clinical Impression(s) / ED Diagnoses Final diagnoses:  Contusion of right foot, initial encounter    Rx / DC Orders ED Discharge Orders     None         Aura Dials 06/01/22 0040    Mesner, Corene Cornea, MD 06/01/22 [id number]

## 2022-06-01 NOTE — Progress Notes (Signed)
Orthopedic Tech Progress Note Patient Details:  Antonio Maddox. Antonio Maddox 1985/10/19 [id number]  Ortho Devices Type of Ortho Device: Crutches Ortho Device/Splint Interventions: Ordered, Application, Adjustment   Post Interventions Patient Tolerated: Well Instructions Provided: Care of device, Adjustment of device  Karolee Stamps 06/01/2022, 1:01 AM

## 2022-06-02 ENCOUNTER — Ambulatory Visit: Payer: Self-pay | Admitting: *Deleted

## 2022-06-02 NOTE — Telephone Encounter (Signed)
Summary: After hospital discharge care   Patient was seen at [facility] ED on 2/26 for a foot contusion. Patient has questions about when he can remove the bandage and shower.      Called [phone] to review questions regarding foot contusion. Caller answered and reports wrong # and hung up.

## 2022-06-02 NOTE — Telephone Encounter (Signed)
Attempted to reach pt, sounded as if call answered, no one would speak.

## 2022-06-16 ENCOUNTER — Ambulatory Visit: Payer: Self-pay | Admitting: Critical Care Medicine

## 2022-06-16 NOTE — Progress Notes (Deleted)
   New/toc Patient Office Visit  Subjective    Patient ID: Antonio Maddox, male    DOB: 08-02-1985  Age: 37 y.o. MRN: [id number]  CC: No chief complaint on file.   HPI Antonio Maddox. Strength presents to establish care HFU [facility] NEW PT ED 05/31/22 work injury pallet drop on  R foot  Outpatient Encounter Medications as of 06/16/2022  Medication Sig   acetaminophen-codeine (TYLENOL #3) 300-30 MG per tablet Take 1-2 tablets by mouth every 4 (four) hours as needed for moderate pain.   docusate sodium (COLACE) 100 MG capsule Take 2 capsules (200 mg total) by mouth 2 (two) times daily. (Patient not taking: Reported on 05/31/2022)   metoCLOPramide (REGLAN) 10 MG tablet Take 1 tablet (10 mg total) by mouth 4 (four) times daily. (Patient not taking: Reported on 05/31/2022)   polyethylene glycol (MIRALAX / GLYCOLAX) packet Take 17 g by mouth 2 (two) times daily.   Vitamin D, Ergocalciferol, (DRISDOL) 50000 UNITS CAPS capsule Take 1 capsule (50,000 Units total) by mouth every 7 (seven) days. (Patient not taking: Reported on 05/31/2022)   No facility-administered encounter medications on file as of 06/16/2022.    Past Medical History:  Diagnosis Date   Constipation    H/O exploratory laparotomy     Past Surgical History:  Procedure Laterality Date   ABDOMINAL SURGERY     HAND SURGERY      No family history on file.  Social History   Socioeconomic History   Marital status: Single    Spouse name: Not on file   Number of children: Not on file   Years of education: Not on file   Highest education level: Not on file  Occupational History   Not on file  Tobacco Use   Smoking status: Every Day    Types: Cigarettes   Smokeless tobacco: Not on file  Substance and Sexual Activity   Alcohol use: No   Drug use: Not on file   Sexual activity: Not on file  Other Topics Concern   Not on file  Social History Narrative   Not on file   Social Determinants of Health   Financial Resource Strain: Not  on file  Food Insecurity: Not on file  Transportation Needs: Not on file  Physical Activity: Not on file  Stress: Not on file  Social Connections: Not on file  Intimate Partner Violence: Not on file    ROS      Objective    There were no vitals taken for this visit.  Physical Exam  {Labs (Optional):23779}    Assessment & Plan:   Problem List Items Addressed This Visit   None   No follow-ups on file.   Asencion Noble, MD
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
## 2022-07-06 ENCOUNTER — Ambulatory Visit: Payer: Self-pay | Attending: Critical Care Medicine | Admitting: Critical Care Medicine
